# Patient Record
Sex: Male | Born: 1985 | Race: White | Hispanic: No | Marital: Single | Smoking: Current some day smoker
Health system: Southern US, Community
[De-identification: ages and names within clinical notes are randomized; demographics above are authoritative.]

---
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
## 2010-03-23 ENCOUNTER — Encounter
Admission: RE | Admit: 2010-03-23 | Discharge: 2010-04-07 | Payer: Self-pay | Source: Home / Self Care | Admitting: Orthopedic Surgery

## 2010-04-18 ENCOUNTER — Encounter
Admission: RE | Admit: 2010-04-18 | Discharge: 2010-04-20 | Payer: Self-pay | Source: Home / Self Care | Admitting: Orthopedic Surgery

## 2010-07-27 ENCOUNTER — Encounter
Admission: RE | Admit: 2010-07-27 | Discharge: 2010-07-27 | Payer: Self-pay | Source: Home / Self Care | Attending: Occupational Medicine | Admitting: Occupational Medicine

## 2013-08-28 ENCOUNTER — Other Ambulatory Visit: Payer: Self-pay | Admitting: Orthopedic Surgery

## 2013-08-28 DIAGNOSIS — M25511 Pain in right shoulder: Secondary | ICD-10-CM

## 2013-09-03 ENCOUNTER — Other Ambulatory Visit: Payer: Self-pay

## 2013-09-03 ENCOUNTER — Ambulatory Visit
Admission: RE | Admit: 2013-09-03 | Discharge: 2013-09-03 | Disposition: A | Discharge status: Home / Self Care | Payer: BC Managed Care – PPO | Source: Ambulatory Visit | Attending: Orthopedic Surgery | Admitting: Orthopedic Surgery

## 2013-09-03 DIAGNOSIS — M25511 Pain in right shoulder: Secondary | ICD-10-CM

## 2014-04-01 ENCOUNTER — Emergency Department (HOSPITAL_COMMUNITY)
Admission: EM | Admit: 2014-04-01 | Discharge: 2014-04-01 | Disposition: A | Discharge status: Home / Self Care | Payer: 59 | Attending: Emergency Medicine | Admitting: Emergency Medicine

## 2014-04-01 ENCOUNTER — Emergency Department (HOSPITAL_COMMUNITY): Payer: 59

## 2014-04-01 ENCOUNTER — Encounter (HOSPITAL_COMMUNITY): Payer: Self-pay | Admitting: Emergency Medicine

## 2014-04-01 DIAGNOSIS — R0789 Other chest pain: Secondary | ICD-10-CM | POA: Diagnosis not present

## 2014-04-01 DIAGNOSIS — F17200 Nicotine dependence, unspecified, uncomplicated: Secondary | ICD-10-CM | POA: Insufficient documentation

## 2014-04-01 DIAGNOSIS — R079 Chest pain, unspecified: Secondary | ICD-10-CM | POA: Insufficient documentation

## 2014-04-01 LAB — CBC
HCT: 45.7 % (ref 39.0–52.0)
Hemoglobin: 16.5 g/dL (ref 13.0–17.0)
MCH: 30.4 pg (ref 26.0–34.0)
MCHC: 36.1 g/dL — AB (ref 30.0–36.0)
MCV: 84.3 fL (ref 78.0–100.0)
PLATELETS: 198 10*3/uL (ref 150–400)
RBC: 5.42 MIL/uL (ref 4.22–5.81)
RDW: 12 % (ref 11.5–15.5)
WBC: 5.8 10*3/uL (ref 4.0–10.5)

## 2014-04-01 LAB — BASIC METABOLIC PANEL
Anion gap: 15 (ref 5–15)
BUN: 13 mg/dL (ref 6–23)
CALCIUM: 9.3 mg/dL (ref 8.4–10.5)
CHLORIDE: 101 meq/L (ref 96–112)
CO2: 23 meq/L (ref 19–32)
CREATININE: 0.93 mg/dL (ref 0.50–1.35)
GFR calc Af Amer: >90 mL/min (ref >90)
GFR calc non Af Amer: >90 mL/min (ref >90)
GLUCOSE: 123 mg/dL — AB (ref 70–99)
Potassium: 4 mEq/L (ref 3.7–5.3)
SODIUM: 139 meq/L (ref 137–147)

## 2014-04-01 LAB — I-STAT TROPONIN, ED
TROPONIN I, POC: 0 ng/mL (ref 0.00–0.08)
TROPONIN I, POC: 0 ng/mL (ref 0.00–0.08)

## 2014-04-01 MED ORDER — ASPIRIN 81 MG PO CHEW
324.0000 mg | CHEWABLE_TABLET | Freq: Once | ORAL | Status: AC
  Administered 2014-04-01: 324 mg via ORAL
  Filled 2014-04-01: qty 4

## 2014-04-01 NOTE — Discharge Instructions (Signed)
If you were given medicines take as directed.  If you are on coumadin or contraceptives realize their levels and effectiveness is altered by many different medicines.  If you have any reaction (rash, tongues swelling, other) to the medicines stop taking and see a physician.   Please follow up as directed and return to the ER or see a physician for new or worsening symptoms.  Thank you. Filed Vitals:   04/01/14 0124 04/01/14 0230 04/01/14 0245 04/01/14 0300  BP: 161/80 145/65 145/64 135/64  Pulse: 91 94 87 92  Temp: 98.6 F (37 C)     TempSrc: Oral     Resp: SpO2: 100% 100% 100% 100%

## 2014-04-01 NOTE — ED Provider Notes (Signed)
CSN: [id number]     Arrival date & time 04/01/14  0116 History   First MD Initiated Contact with Patient 04/01/14 0226     Chief Complaint  Patient presents with  . Chest Pain     (Consider location/radiation/quality/duration/timing/severity/associated sxs/prior Treatment) HPI Comments: 28 year old male with no significant medical history, occasional cigar use, no family history of early cardiac disease known presents with intermittent left chest pain worse with laughing, lifting his left arm and breathing. Patient denies shortness of breath, diaphoresis or exertional symptoms. Pain is sharp left upper chest. No significant radiation.Patient denies blood clot history, active cancer, recent major trauma or surgery, unilateral leg swelling/ pain, recent long travel, hemoptysis or oral contraceptives.   Patient is a 28 y.o. male presenting with chest pain. The history is provided by the patient.  Chest Pain Associated symptoms: no abdominal pain, no back pain, no fever, no headache, no shortness of breath and not vomiting     History reviewed. No pertinent past medical history. History reviewed. No pertinent past surgical history. No family history on file. History  Substance Use Topics  . Smoking status: Current Some Day Smoker  . Smokeless tobacco: Not on file  . Alcohol Use: Yes    Review of Systems  Constitutional: Negative for fever and chills.  HENT: Negative for congestion.   Eyes: Negative for visual disturbance.  Respiratory: Negative for shortness of breath.   Cardiovascular: Positive for chest pain.  Gastrointestinal: Negative for vomiting and abdominal pain.  Genitourinary: Negative for dysuria and flank pain.  Musculoskeletal: Negative for back pain, neck pain and neck stiffness.  Skin: Negative for rash.  Neurological: Negative for light-headedness and headaches.      Allergies  Review of patient's allergies indicates no known allergies.  Home Medications    Prior to Admission medications   Not on File   BP 135/64  Pulse 92  Temp(Src) 98.6 F (37 C) (Oral)  Resp 16  SpO2 100% Physical Exam  Nursing note and vitals reviewed. Constitutional: He is oriented to person, place, and time. He appears well-developed and well-nourished.  HENT:  Head: Normocephalic and atraumatic.  Eyes: Conjunctivae are normal. Right eye exhibits no discharge. Left eye exhibits no discharge.  Neck: Normal range of motion. Neck supple. No tracheal deviation present.  Cardiovascular: Normal rate, regular rhythm and intact distal pulses.   No murmur heard. Pulmonary/Chest: Effort normal and breath sounds normal.  Abdominal: Soft. He exhibits no distension. There is no tenderness. There is no guarding.  Musculoskeletal: He exhibits no edema and no tenderness.  Neurological: He is alert and oriented to person, place, and time.  Skin: Skin is warm. No rash noted.  Psychiatric: He has a normal mood and affect.    ED Course  Procedures (including critical care time) Labs Review Labs Reviewed  CBC - Abnormal; Notable for the following:    MCHC 36.1 (*)    All other components within normal limits  BASIC METABOLIC PANEL - Abnormal; Notable for the following:    Glucose, Bld 123 (*)    All other components within normal limits  I-STAT TROPOININ, ED  Rosezena Sensor, ED    Imaging Review Dg Chest 2 View  04/01/2014   CLINICAL DATA:  Chest pain this evening  EXAM: CHEST  2 VIEW  COMPARISON:  07/1910  FINDINGS: The heart size and mediastinal contours are within normal limits. Both lungs are clear. The visualized skeletal structures are unremarkable.  IMPRESSION: No active cardiopulmonary disease.  Electronically Signed   By: Esperanza Heir M.D.   On: 04/01/2014 01:48     EKG Interpretation   Date/Time:  Thursday April 01 2014 01:19:22 EDT Ventricular Rate:  98 PR Interval:  130 QRS Duration: 82 QT Interval:  316 QTC Calculation: 403 R Axis:    88 Text Interpretation:  Normal sinus rhythm with sinus arrhythmia Normal ECG  Confirmed by Brieanna Nau  MD, [place] Doster ([id number]) on 04/01/2014 2:26:30 AM      MDM   Final diagnoses:  Chest pain, atypical   Well-appearing healthy male, very low risk cardiac in very low-risk pulmonary embolism differential. Clinically likely musculoskeletal as worse with movement, laughing. Screening EKG and troponin negative, patient has had symptoms since 9 PM tonight. Patient has PE RC negative, no indication for d-dimer at this time. Discuss followup outpatient, patient comfortable the plan, ibuprofen ordered for pain. CXR reviewed no acute findings. Patient improved in ER. Delta troponin negative. Patient comfortable with outpatient followup. The patients results and plan were reviewed and discussed.   Any x-rays performed were personally reviewed by myself.   Differential diagnosis were considered with the presenting HPI.  Medications  aspirin chewable tablet 324 mg (324 mg Oral Given 04/01/14 0404)    Filed Vitals:   04/01/14 0124 04/01/14 0230 04/01/14 0245 04/01/14 0300  BP: 161/80 145/65 145/64 135/64  Pulse: 91 94 87 92  Temp: 98.6 F (37 C)     TempSrc: Oral     Resp: SpO2: 100% 100% 100% 100%    Final diagnoses:  Chest pain, atypical    Admission/ observation were discussed with the admitting physician, patient and/or family and they are comfortable with the plan.      Enid Skeens, MD 04/01/14 [phone]

## 2014-04-01 NOTE — ED Notes (Signed)
Pt. reports intermittent left chest pain onset this evening worse with deep inspiration and laughing , denies SOB /nausea or diaphoresis . No cough or congestion .

## 2015-03-14 IMAGING — CR DG CHEST 2V
2 series · 2 of 2 positions shown · non-contrast
Comparison: [DATE]

CLINICAL DATA: Chest pain this evening

EXAM:
CHEST  2 VIEW

[w chest pa]
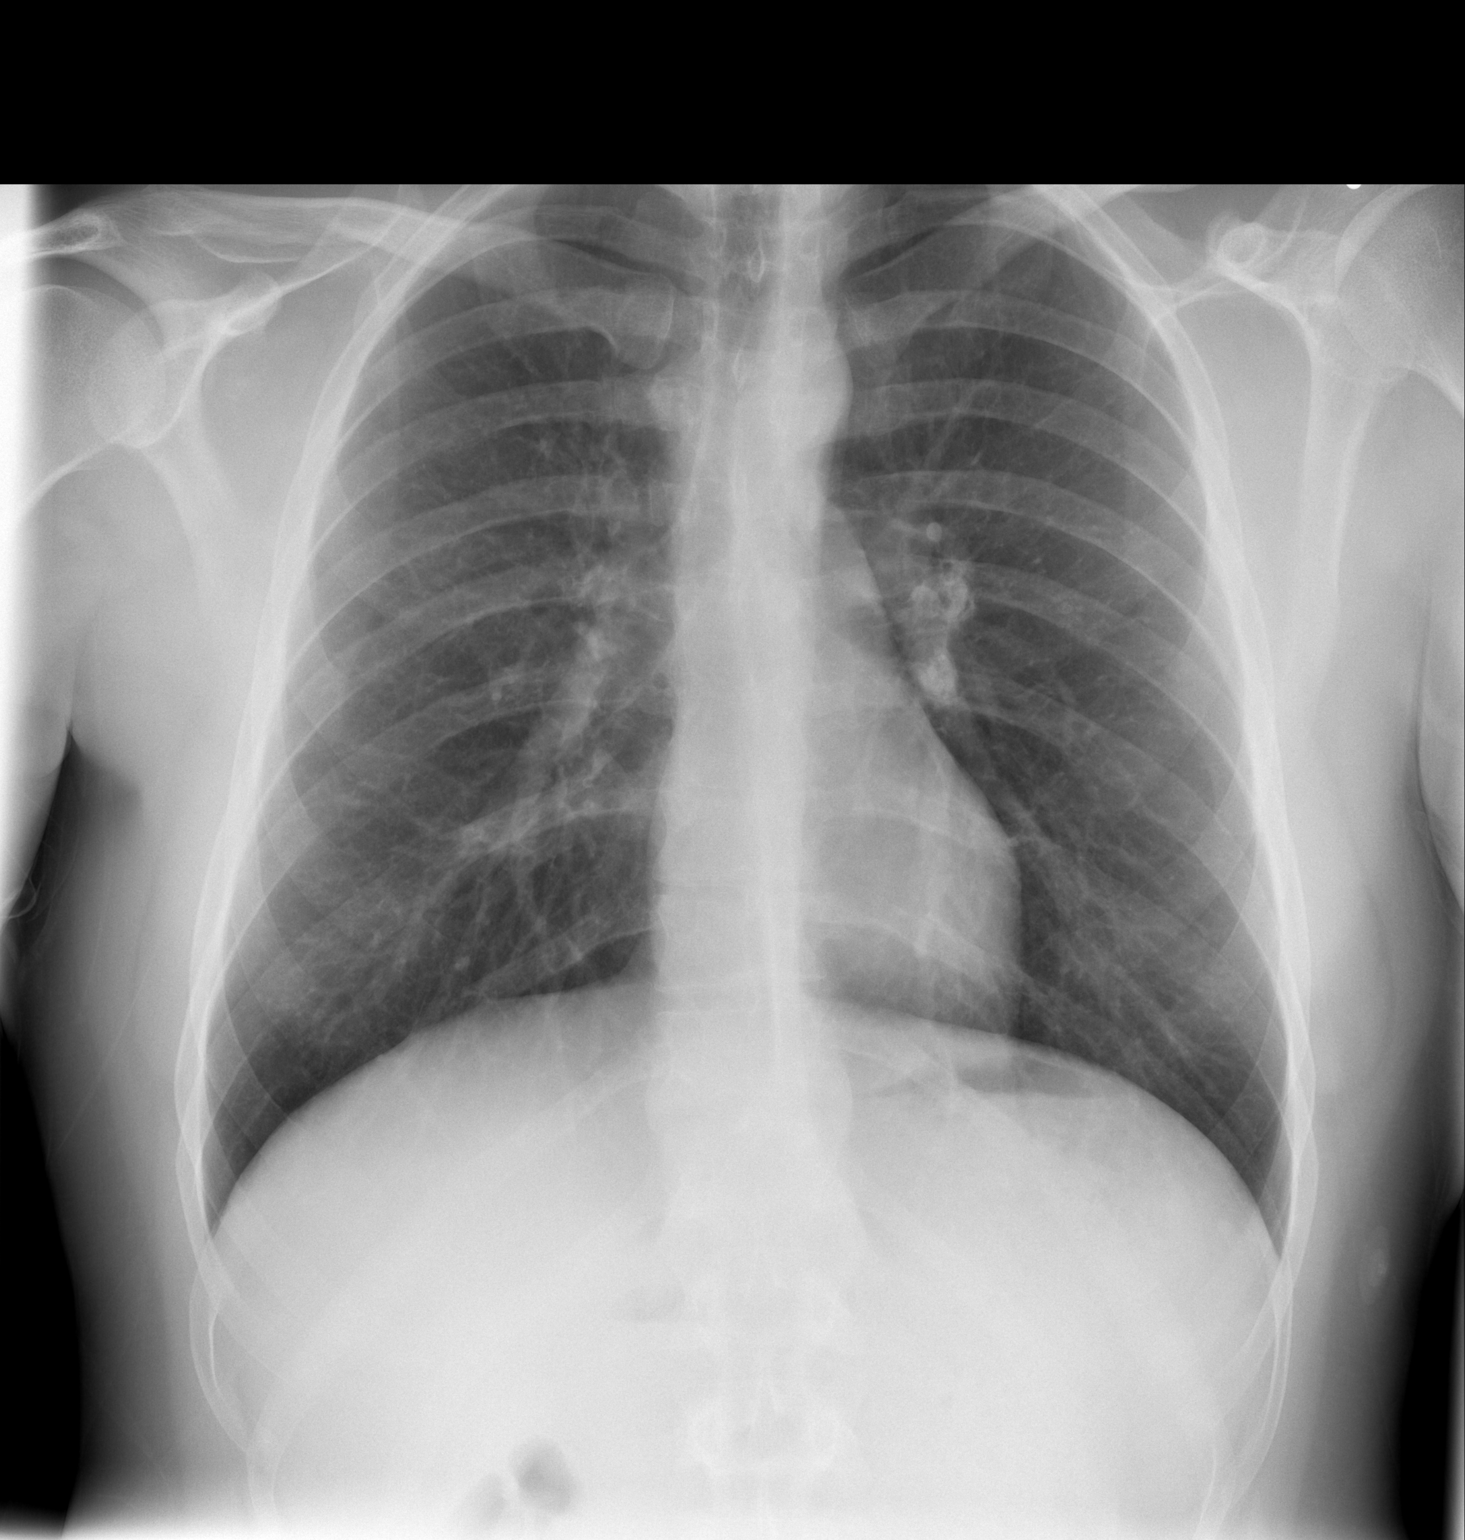

[w chest lat]
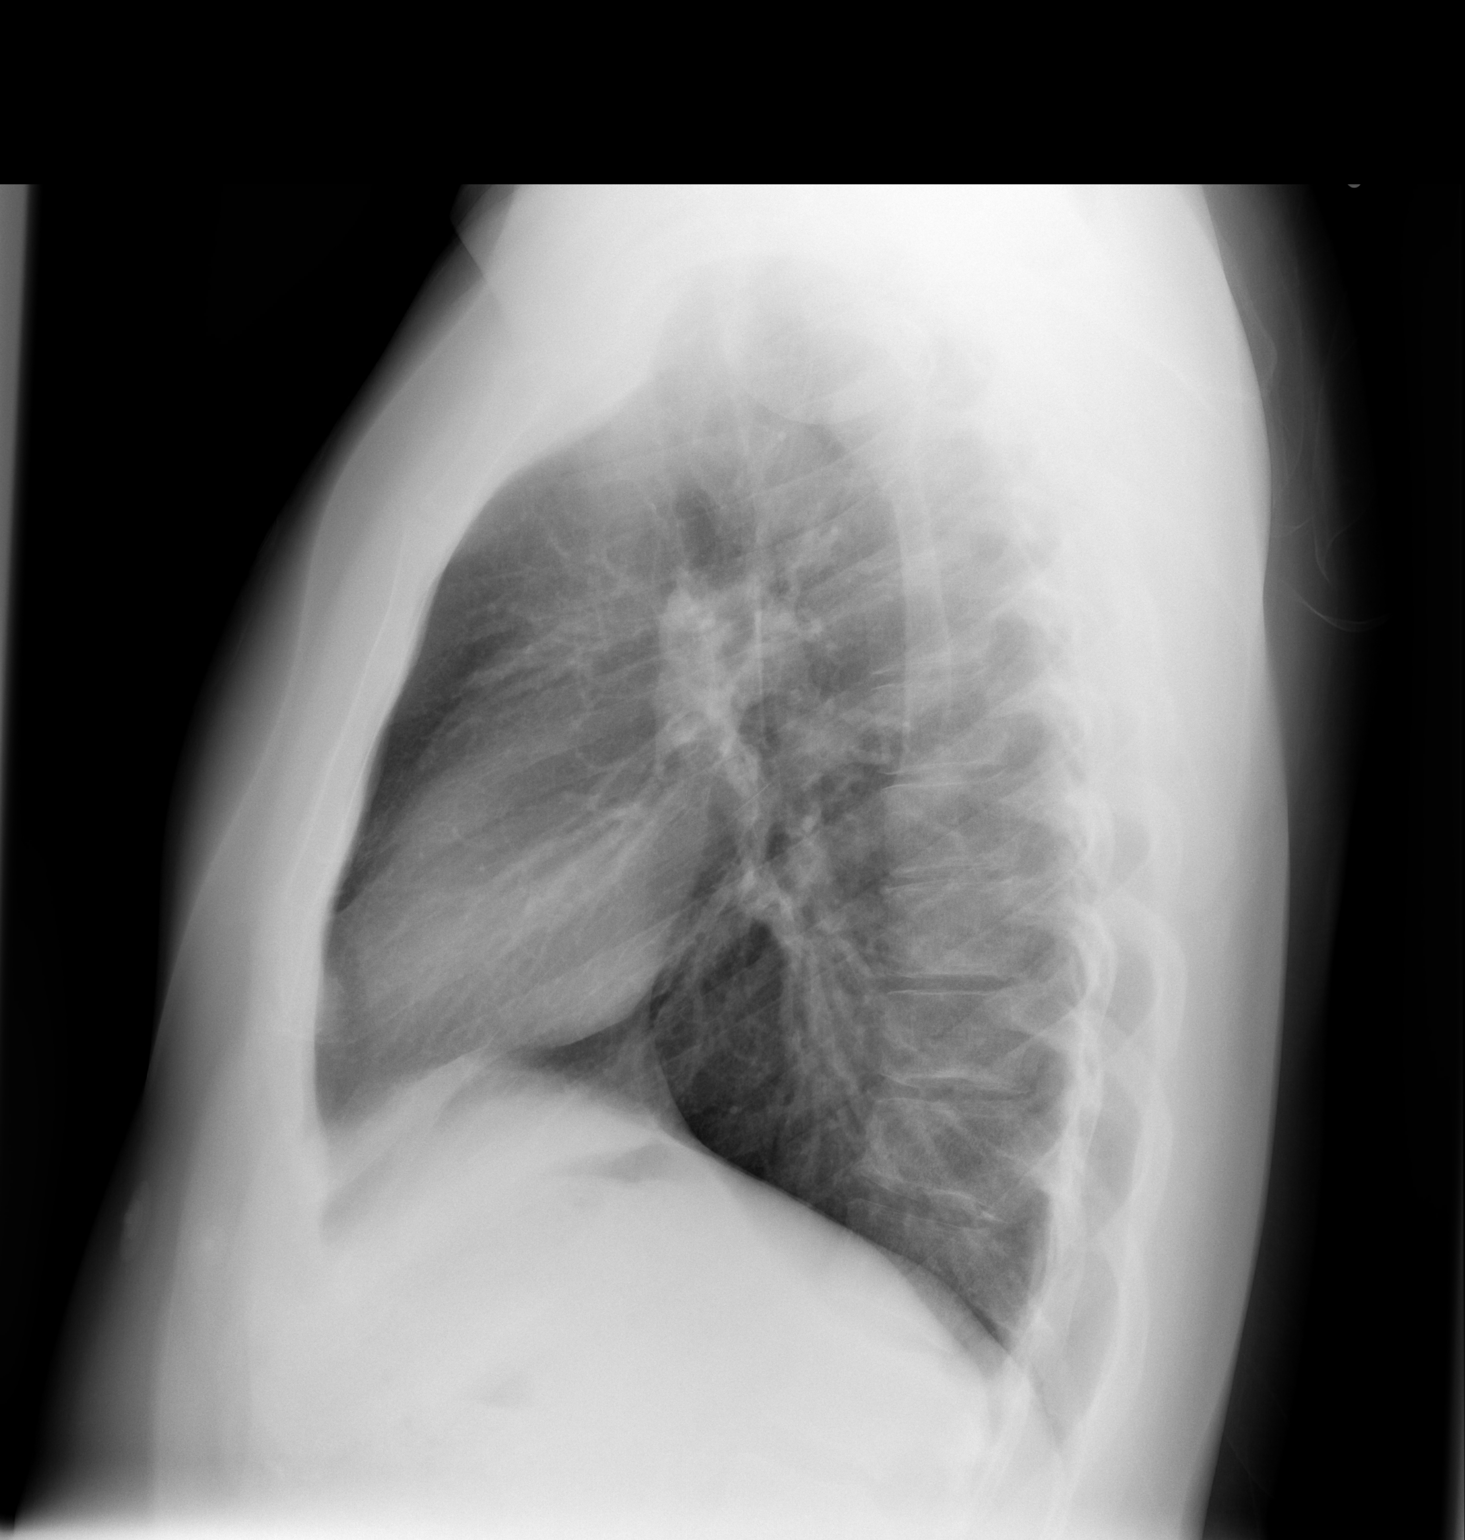

[2 of 2 positions shown; findings below may reference images not displayed]

FINDINGS: The heart size and mediastinal contours are within normal limits.
Both lungs are clear. The visualized skeletal structures are
unremarkable.
IMPRESSION: No active cardiopulmonary disease.
